# Patient Record
Sex: Male | Born: 1960 | Race: White | Hispanic: Yes | Marital: Married | Smoking: Never smoker
Health system: Southern US, Community
[De-identification: ages and names within clinical notes are randomized; demographics above are authoritative.]

## PROBLEM LIST (undated history)

## (undated) ENCOUNTER — Ambulatory Visit: Admission: EM | Payer: 59

## (undated) ENCOUNTER — Ambulatory Visit: Payer: 59

## (undated) DIAGNOSIS — J45909 Unspecified asthma, uncomplicated: Secondary | ICD-10-CM

## (undated) DIAGNOSIS — I10 Essential (primary) hypertension: Secondary | ICD-10-CM

---

## 2017-08-28 ENCOUNTER — Emergency Department (HOSPITAL_COMMUNITY)
Admission: EM | Admit: 2017-08-28 | Discharge: 2017-08-28 | Discharge status: Against Medical Advice | Payer: Self-pay | Attending: Emergency Medicine | Admitting: Emergency Medicine

## 2017-08-28 ENCOUNTER — Emergency Department (HOSPITAL_COMMUNITY): Payer: Self-pay

## 2017-08-28 ENCOUNTER — Encounter (HOSPITAL_COMMUNITY): Payer: Self-pay | Admitting: Emergency Medicine

## 2017-08-28 DIAGNOSIS — R079 Chest pain, unspecified: Secondary | ICD-10-CM | POA: Insufficient documentation

## 2017-08-28 DIAGNOSIS — Z5321 Procedure and treatment not carried out due to patient leaving prior to being seen by health care provider: Secondary | ICD-10-CM | POA: Insufficient documentation

## 2017-08-28 HISTORY — DX: Essential (primary) hypertension: I10

## 2017-08-28 NOTE — ED Triage Notes (Signed)
Pt c/o left chest spasms when he lifts his arms or lays back. Pt denies any pain at this time.

## 2017-08-28 NOTE — ED Notes (Signed)
Pt left facility per registration. 

## 2018-03-15 ENCOUNTER — Emergency Department (HOSPITAL_COMMUNITY)
Admission: EM | Admit: 2018-03-15 | Discharge: 2018-03-15 | Disposition: A | Discharge status: Home / Self Care | Payer: Self-pay | Attending: Emergency Medicine | Admitting: Emergency Medicine

## 2018-03-15 ENCOUNTER — Other Ambulatory Visit: Payer: Self-pay

## 2018-03-15 ENCOUNTER — Encounter (HOSPITAL_COMMUNITY): Payer: Self-pay | Admitting: *Deleted

## 2018-03-15 DIAGNOSIS — I10 Essential (primary) hypertension: Secondary | ICD-10-CM | POA: Insufficient documentation

## 2018-03-15 DIAGNOSIS — R42 Dizziness and giddiness: Secondary | ICD-10-CM | POA: Insufficient documentation

## 2018-03-15 DIAGNOSIS — E860 Dehydration: Secondary | ICD-10-CM | POA: Insufficient documentation

## 2018-03-15 HISTORY — DX: Unspecified asthma, uncomplicated: J45.909

## 2018-03-15 LAB — COMPREHENSIVE METABOLIC PANEL
ALT: 42 U/L (ref 17–63)
AST: 27 U/L (ref 15–41)
Albumin: 4.3 g/dL (ref 3.5–5.0)
Alkaline Phosphatase: 60 U/L (ref 38–126)
Anion gap: 10 (ref 5–15)
BUN: 22 mg/dL — ABNORMAL HIGH (ref 6–20)
CHLORIDE: 103 mmol/L (ref 101–111)
CO2: 23 mmol/L (ref 22–32)
CREATININE: 1.12 mg/dL (ref 0.61–1.24)
Calcium: 10.4 mg/dL — ABNORMAL HIGH (ref 8.9–10.3)
Glucose, Bld: 140 mg/dL — ABNORMAL HIGH (ref 65–99)
POTASSIUM: 4.2 mmol/L (ref 3.5–5.1)
SODIUM: 136 mmol/L (ref 135–145)
Total Bilirubin: 0.7 mg/dL (ref 0.3–1.2)
Total Protein: 7.6 g/dL (ref 6.5–8.1)

## 2018-03-15 LAB — URINALYSIS, ROUTINE W REFLEX MICROSCOPIC
BACTERIA UA: NONE SEEN
BILIRUBIN URINE: NEGATIVE
Glucose, UA: NEGATIVE mg/dL
KETONES UR: 5 mg/dL — AB
Leukocytes, UA: NEGATIVE
NITRITE: NEGATIVE
Protein, ur: NEGATIVE mg/dL
SQUAMOUS EPITHELIAL / LPF: NONE SEEN
Specific Gravity, Urine: 1.016 (ref 1.005–1.030)
pH: 5 (ref 5.0–8.0)

## 2018-03-15 LAB — CBC WITH DIFFERENTIAL/PLATELET
Basophils Absolute: 0 10*3/uL (ref 0.0–0.1)
Basophils Relative: 0 %
EOS ABS: 0.2 10*3/uL (ref 0.0–0.7)
Eosinophils Relative: 2 %
HEMATOCRIT: 53 % — AB (ref 39.0–52.0)
Hemoglobin: 17.5 g/dL — ABNORMAL HIGH (ref 13.0–17.0)
LYMPHS PCT: 27 %
Lymphs Abs: 1.9 10*3/uL (ref 0.7–4.0)
MCH: 29.3 pg (ref 26.0–34.0)
MCHC: 33 g/dL (ref 30.0–36.0)
MCV: 88.8 fL (ref 78.0–100.0)
MONOS PCT: 8 %
Monocytes Absolute: 0.6 10*3/uL (ref 0.1–1.0)
NEUTROS ABS: 4.3 10*3/uL (ref 1.7–7.7)
NEUTROS PCT: 63 %
Platelets: 214 10*3/uL (ref 150–400)
RBC: 5.97 MIL/uL — AB (ref 4.22–5.81)
RDW: 13.3 % (ref 11.5–15.5)
WBC: 6.9 10*3/uL (ref 4.0–10.5)

## 2018-03-15 LAB — I-STAT TROPONIN, ED: Troponin i, poc: 0 ng/mL (ref 0.00–0.08)

## 2018-03-15 MED ORDER — FOSINOPRIL SODIUM 40 MG PO TABS: 40.0000 mg | ORAL_TABLET | Freq: Every day | ORAL | 0 refills | Status: DC

## 2018-03-15 NOTE — ED Provider Notes (Signed)
Emergency Department Provider Note   I have reviewed the triage vital signs and the nursing notes.   HISTORY  Chief Complaint Dizziness   HPI Minerva FesterDavid Kuhlmann is a 57 y.o. male with PMH of HTN presents to the emergency department for evaluation of lightheadedness, elevated blood pressure, vision changes with using glasses over the past week.  The patient recently had a severe gastrointestinal infection which resulted in dehydration and a syncopal event.  He is no longer having vomiting or diarrhea but has noticed generalized fatigue with some lightheadedness.  He has noticed some changes in his vision which he has only when using his glasses.  If he removes the glasses he no longer has these changes.  He states it is not exactly blurry vision but more like he feels when he needs to have his prescription updated, although states it was just updated 4 months ago.  He checked his blood pressure today and found it to be significantly elevated.  Since having the GI illness he has been out of the routine of taking his blood pressure medication.    Past Medical History:  Diagnosis Date  . Asthma    as a child  . Hypertension     There are no active problems to display for this patient.   History reviewed. No pertinent surgical history.    Allergies Patient has no known allergies.  No family history on file.  Social History Social History   Tobacco Use  . Smoking status: Never Smoker  . Smokeless tobacco: Never Used  Substance Use Topics  . Alcohol use: No  . Drug use: No    Review of Systems  Constitutional: No fever/chills. Positive lightheadedness.  Eyes: Positive visual changes. ENT: No sore throat. Cardiovascular: Denies chest pain. Elevated BP.  Respiratory: Denies shortness of breath. Gastrointestinal: No abdominal pain.  No nausea, no vomiting.  No diarrhea.  No constipation. Genitourinary: Negative for dysuria. Musculoskeletal: Negative for back pain. Skin:  Negative for rash. Neurological: Negative for headaches, focal weakness or numbness.  10-point ROS otherwise negative.  ____________________________________________   PHYSICAL EXAM:  VITAL SIGNS: ED Triage Vitals  Enc Vitals Group     BP 03/15/18 1713 (!) 182/105     Pulse Rate 03/15/18 1713 (!) 116     Resp 03/15/18 1713 16     Temp 03/15/18 1713 98.7 F (37.1 C)     Temp Source 03/15/18 1713 Oral     SpO2 03/15/18 1713 97 %     Weight 03/15/18 1713 195 lb (88.5 kg)     Height 03/15/18 1713 5\' 6"  (1.676 m)     Pain Score 03/15/18 1714 0   Constitutional: Alert and oriented. Well appearing and in no acute distress. Eyes: Conjunctivae are normal. PERRL. EOMI. Head: Atraumatic. Nose: No congestion/rhinnorhea. Mouth/Throat: Mucous membranes are moist.  Neck: No stridor.  Cardiovascular: Normal rate, regular rhythm. Good peripheral circulation. Grossly normal heart sounds.   Respiratory: Normal respiratory effort.  No retractions. Lungs CTAB. Gastrointestinal: Soft and nontender. No distention.  Musculoskeletal: No lower extremity tenderness nor edema. No gross deformities of extremities. Neurologic:  Normal speech and language. No gross focal neurologic deficits are appreciated. Normal finger-to-nose testing. Normal gait.  Skin:  Skin is warm, dry and intact. No rash noted.  ____________________________________________   LABS (all labs ordered are listed, but only abnormal results are displayed)  Labs Reviewed  COMPREHENSIVE METABOLIC PANEL - Abnormal; Notable for the following components:      Result  Value   Glucose, Bld 140 (*)    BUN 22 (*)    Calcium 10.4 (*)    All other components within normal limits  CBC WITH DIFFERENTIAL/PLATELET - Abnormal; Notable for the following components:   RBC 5.97 (*)    Hemoglobin 17.5 (*)    HCT 53.0 (*)    All other components within normal limits  URINALYSIS, ROUTINE W REFLEX MICROSCOPIC - Abnormal; Notable for the following  components:   Color, Urine AMBER (*)    APPearance CLOUDY (*)    Hgb urine dipstick MODERATE (*)    Ketones, ur 5 (*)    All other components within normal limits  I-STAT TROPONIN, ED   ____________________________________________  EKG   EKG Interpretation  Date/Time:  Thursday March 15 2018 17:15:49 EDT Ventricular Rate:  112 PR Interval:  158 QRS Duration: 84 QT Interval:  324 QTC Calculation: 442 R Axis:   25 Text Interpretation:  Sinus tachycardia Abnormal ECG No STEMI.  Confirmed by Alona Bene [phone]) on 03/15/2018 7:06:18 PM       ____________________________________________  RADIOLOGY  None ____________________________________________   PROCEDURES  Procedure(s) performed:   Procedures  None ____________________________________________   INITIAL IMPRESSION / ASSESSMENT AND PLAN / ED COURSE  Pertinent labs & imaging results that were available during my care of the patient were reviewed by me and considered in my medical decision making (see chart for details).  Patient presents to the emergency department for evaluation of intermittent lightheadedness with elevated blood pressure noticed today along with vision changes.  The vision changes are only present when he is wearing his glasses and resolve when he takes them off.  He has normal extraocular movements.  His neurological exam is completely unremarkable including gait along with finger to nose testing.  He recently got over a severe gastrointestinal infection so suspect some ongoing mild dehydration but he is tolerating oral fluids.  With his blood pressure being elevated I will not be administering IV fluids here in the emergency department.  I have no signs on exam to suggest hypertension emergency.  The patient's EKG reviewed with no acute findings. No indication for advance brain imaging at this time.   Labs reviewed with no acute findings. Refilled patient's BP meds and he will f/u with PCP regarding  BP follow up. Will call optometry regarding eyeglass Rx change. Discussed ED return precautions in detail.   At this time, I do not feel there is any life-threatening condition present. I have reviewed and discussed all results (EKG, imaging, lab, urine as appropriate), exam findings with patient. I have reviewed nursing notes and appropriate previous records.  I feel the patient is safe to be discharged home without further emergent workup. Discussed usual and customary return precautions. Patient and family (if present) verbalize understanding and are comfortable with this plan.  Patient will follow-up with their primary care provider. If they do not have a primary care provider, information for follow-up has been provided to them. All questions have been answered.  ____________________________________________  FINAL CLINICAL IMPRESSION(S) / ED DIAGNOSES  Final diagnoses:  Lightheadedness  Dehydration  Essential hypertension    Note:  This document was prepared using Dragon voice recognition software and may include unintentional dictation errors.  Alona Bene, MD Emergency Medicine    Kierre Deines, Arlyss Repress, MD 03/16/18 1005

## 2018-03-15 NOTE — ED Triage Notes (Signed)
Pt c/o syncopal episode about a week ago and since then has been having blurry vision, dizziness, and high blood pressure. Pt saw his eye doctor and some slight adjustments were made to his glasses but pt still continues to have trouble with his vision. Pt reports the dizziness started today. Pt reports his SBP has been up to 230. BP 182/105 in triage.

## 2018-03-15 NOTE — Discharge Instructions (Signed)

## 2018-03-15 NOTE — ED Notes (Signed)
Pt had iv to L AC. Iv taken out with cath intact. Nad. No bleeding

## 2019-01-24 IMAGING — DX DG CHEST 2V
2 series · 2 of 2 positions shown · non-contrast
Comparison: None.

CLINICAL DATA: Left upper chest pain for 2-3 days. History of
childhood asthma and hypertension.

EXAM:
CHEST  2 VIEW

[chest pa]
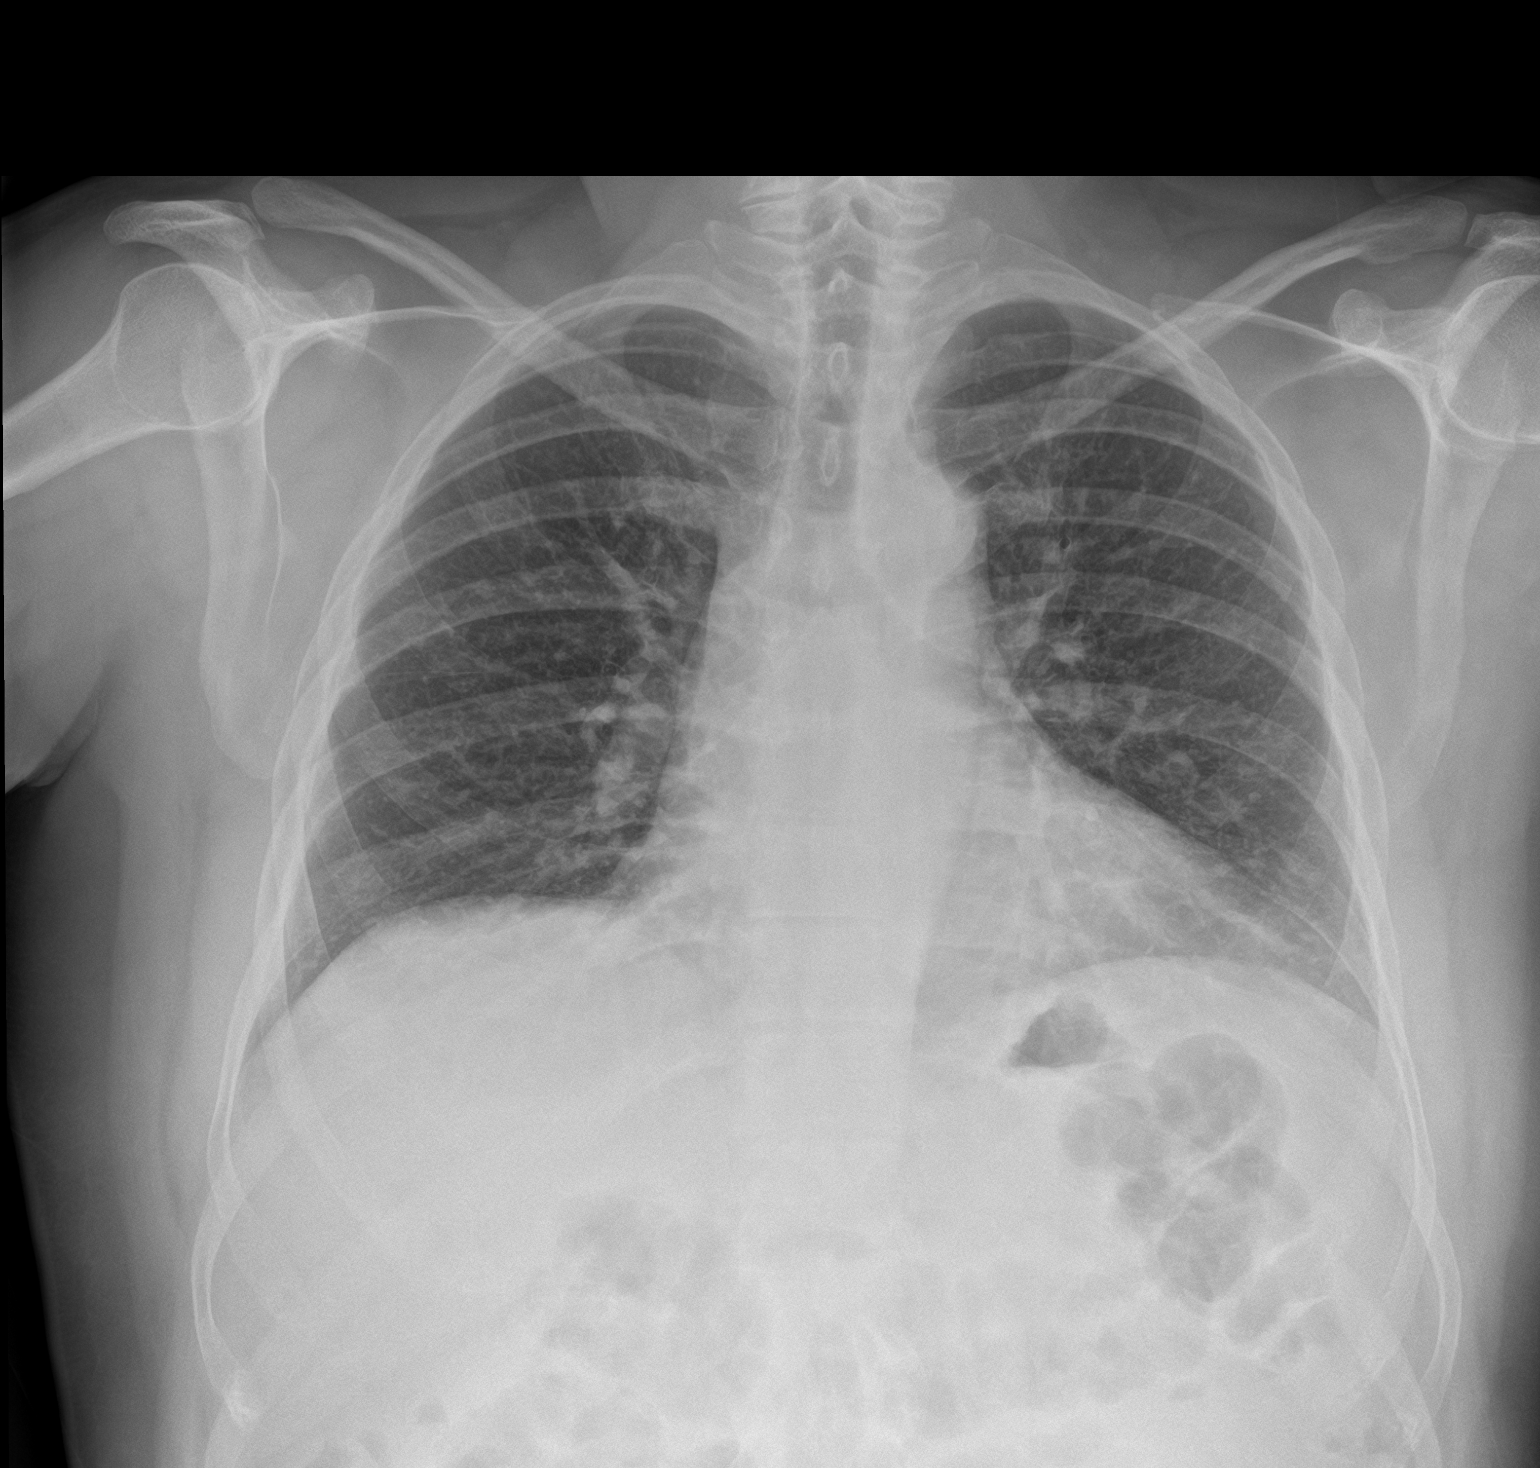

[chest lat]
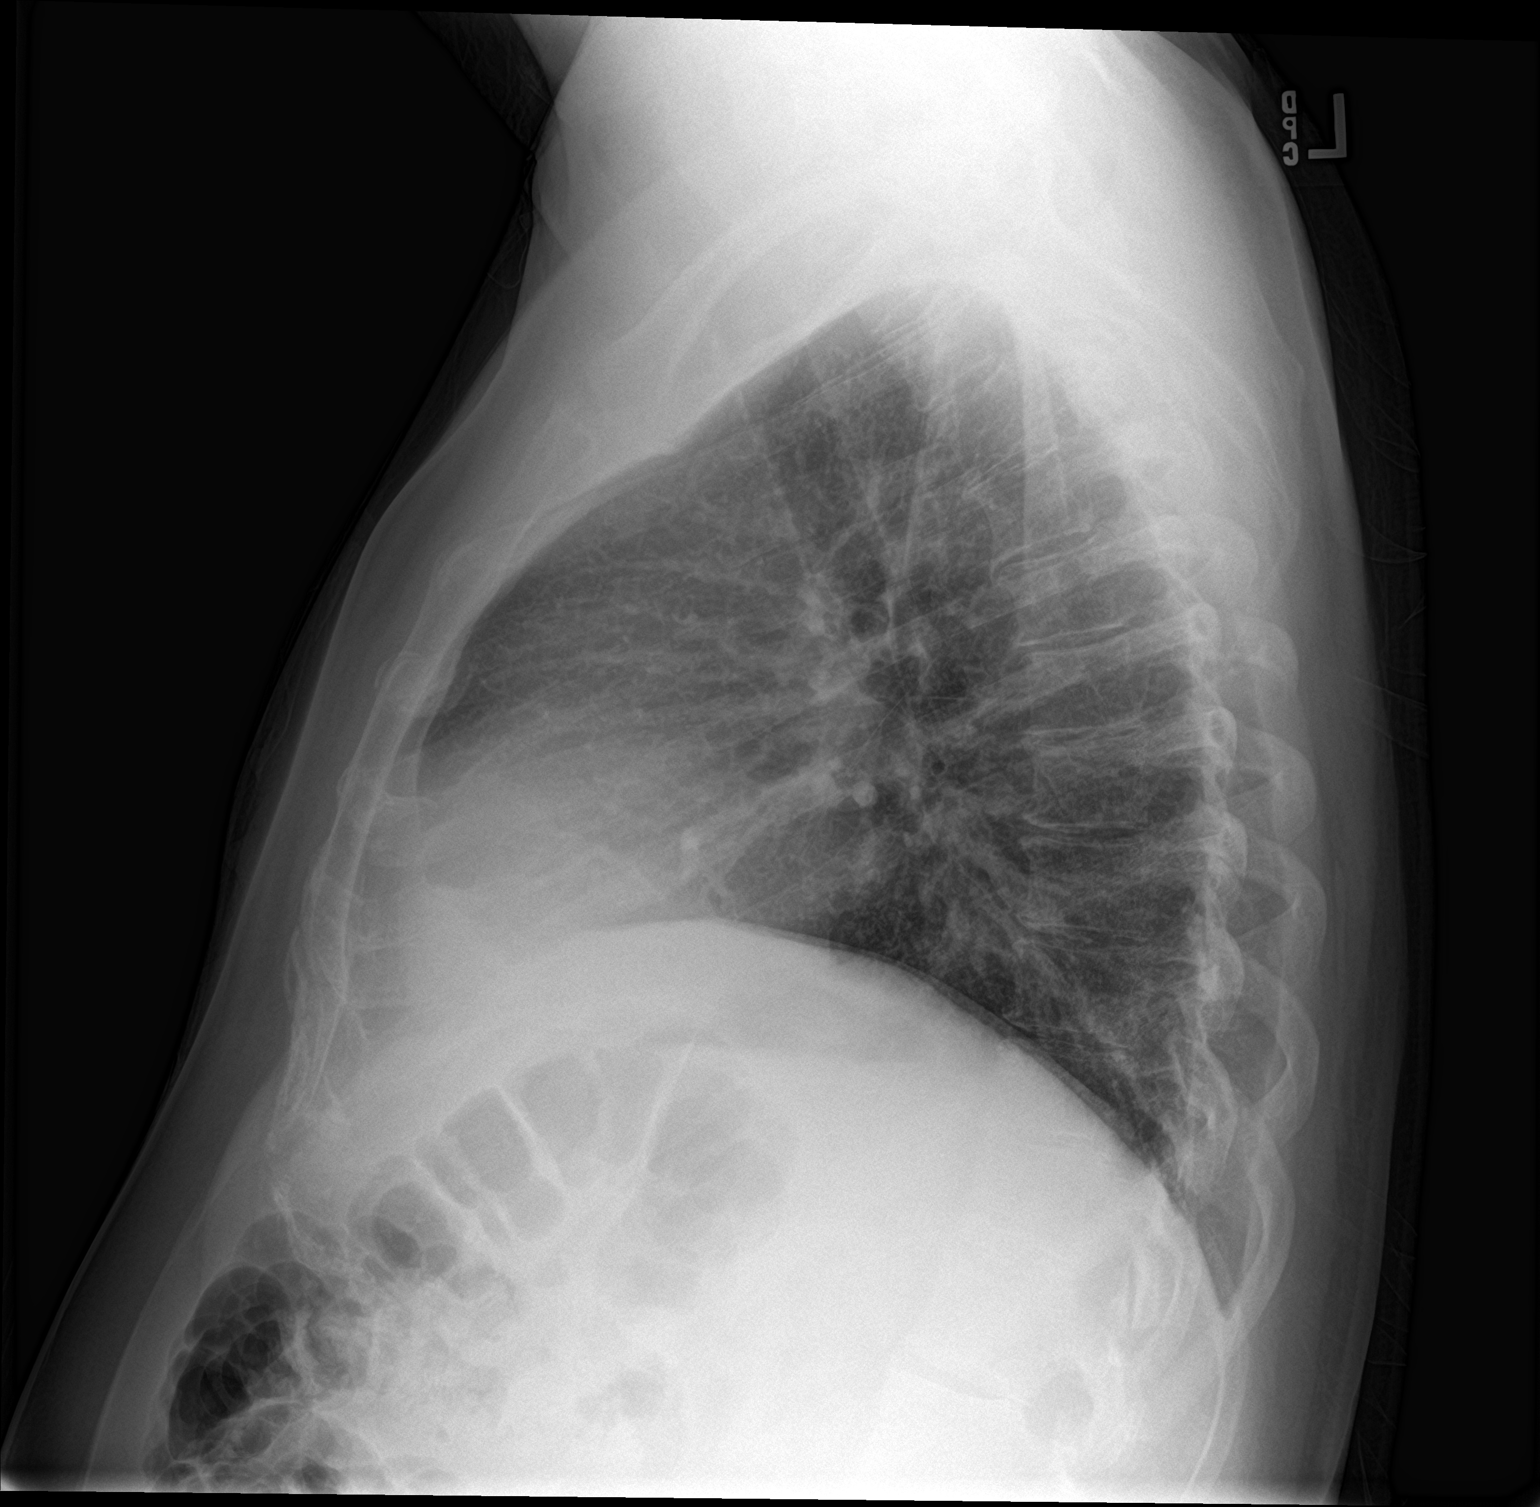

[2 of 2 positions shown; findings below may reference images not displayed]

FINDINGS: Heart size and mediastinal contours are within normal limits. Lungs
are clear. No pleural effusion or pneumothorax seen. Osseous
structures about the chest are unremarkable.
IMPRESSION: No active cardiopulmonary disease. No evidence of pneumonia or
pulmonary edema.

## 2019-10-01 ENCOUNTER — Other Ambulatory Visit: Payer: Self-pay | Admitting: *Deleted

## 2019-10-01 DIAGNOSIS — Z20822 Contact with and (suspected) exposure to covid-19: Secondary | ICD-10-CM

## 2019-10-02 LAB — NOVEL CORONAVIRUS, NAA: SARS-CoV-2, NAA: NOT DETECTED

## 2019-10-03 ENCOUNTER — Telehealth: Payer: Self-pay | Admitting: General Practice

## 2019-10-03 NOTE — Telephone Encounter (Signed)
Negative COVID results given. Patient results "NOT Detected." Caller expressed understanding. ° °

## 2020-04-28 ENCOUNTER — Ambulatory Visit: Payer: Self-pay | Admitting: Physician Assistant

## 2021-05-18 ENCOUNTER — Other Ambulatory Visit: Payer: Self-pay

## 2021-05-18 ENCOUNTER — Emergency Department (HOSPITAL_COMMUNITY)
Admission: EM | Admit: 2021-05-18 | Discharge: 2021-05-18 | Disposition: A | Discharge status: Home / Self Care | Payer: 59 | Attending: Emergency Medicine | Admitting: Emergency Medicine

## 2021-05-18 DIAGNOSIS — I160 Hypertensive urgency: Secondary | ICD-10-CM | POA: Diagnosis not present

## 2021-05-18 DIAGNOSIS — J45909 Unspecified asthma, uncomplicated: Secondary | ICD-10-CM | POA: Diagnosis not present

## 2021-05-18 DIAGNOSIS — Y900 Blood alcohol level of less than 20 mg/100 ml: Secondary | ICD-10-CM | POA: Diagnosis not present

## 2021-05-18 DIAGNOSIS — R030 Elevated blood-pressure reading, without diagnosis of hypertension: Secondary | ICD-10-CM | POA: Diagnosis present

## 2021-05-18 DIAGNOSIS — I10 Essential (primary) hypertension: Secondary | ICD-10-CM | POA: Diagnosis not present

## 2021-05-18 DIAGNOSIS — Z79899 Other long term (current) drug therapy: Secondary | ICD-10-CM | POA: Diagnosis not present

## 2021-05-18 LAB — CBC WITH DIFFERENTIAL/PLATELET
Abs Immature Granulocytes: 0.04 10*3/uL (ref 0.00–0.07)
Basophils Absolute: 0.1 10*3/uL (ref 0.0–0.1)
Basophils Relative: 1 %
Eosinophils Absolute: 0.1 10*3/uL (ref 0.0–0.5)
Eosinophils Relative: 2 %
HCT: 54.4 % — ABNORMAL HIGH (ref 39.0–52.0)
Hemoglobin: 18.2 g/dL — ABNORMAL HIGH (ref 13.0–17.0)
Immature Granulocytes: 1 %
Lymphocytes Relative: 30 %
Lymphs Abs: 2 10*3/uL (ref 0.7–4.0)
MCH: 30 pg (ref 26.0–34.0)
MCHC: 33.5 g/dL (ref 30.0–36.0)
MCV: 89.6 fL (ref 80.0–100.0)
Monocytes Absolute: 0.7 10*3/uL (ref 0.1–1.0)
Monocytes Relative: 9 %
Neutro Abs: 4 10*3/uL (ref 1.7–7.7)
Neutrophils Relative %: 57 %
Platelets: 227 10*3/uL (ref 150–400)
RBC: 6.07 MIL/uL — ABNORMAL HIGH (ref 4.22–5.81)
RDW: 13 % (ref 11.5–15.5)
WBC: 6.9 10*3/uL (ref 4.0–10.5)
nRBC: 0 % (ref 0.0–0.2)

## 2021-05-18 LAB — COMPREHENSIVE METABOLIC PANEL
ALT: 40 U/L (ref 0–44)
AST: 27 U/L (ref 15–41)
Albumin: 4.4 g/dL (ref 3.5–5.0)
Alkaline Phosphatase: 68 U/L (ref 38–126)
Anion gap: 8 (ref 5–15)
BUN: 13 mg/dL (ref 6–20)
CO2: 25 mmol/L (ref 22–32)
Calcium: 9.9 mg/dL (ref 8.9–10.3)
Chloride: 102 mmol/L (ref 98–111)
Creatinine, Ser: 0.99 mg/dL (ref 0.61–1.24)
GFR, Estimated: >60 mL/min (ref >60)
Glucose, Bld: 112 mg/dL — ABNORMAL HIGH (ref 70–99)
Potassium: 3.9 mmol/L (ref 3.5–5.1)
Sodium: 135 mmol/L (ref 135–145)
Total Bilirubin: 0.7 mg/dL (ref 0.3–1.2)
Total Protein: 7.6 g/dL (ref 6.5–8.1)

## 2021-05-18 LAB — URINALYSIS, ROUTINE W REFLEX MICROSCOPIC
Bilirubin Urine: NEGATIVE
Glucose, UA: 50 mg/dL — AB
Hgb urine dipstick: NEGATIVE
Ketones, ur: NEGATIVE mg/dL
Leukocytes,Ua: NEGATIVE
Nitrite: NEGATIVE
Protein, ur: NEGATIVE mg/dL
Specific Gravity, Urine: 1.004 — ABNORMAL LOW (ref 1.005–1.030)
pH: 6 (ref 5.0–8.0)

## 2021-05-18 LAB — ETHANOL: Alcohol, Ethyl (B): <10 mg/dL (ref <10)

## 2021-05-18 LAB — LIPASE, BLOOD: Lipase: 55 U/L — ABNORMAL HIGH (ref 11–51)

## 2021-05-18 NOTE — ED Provider Notes (Signed)
[facility] EMERGENCY DEPARTMENT Provider Note   CSN: [id number] Arrival date & time: 05/18/21  1851     History Chief Complaint  Patient presents with  . Hypertension    Shawn Campbell is a 60 y.o. male.  HPI Patient with a history of hypertension presents with concern for fatigue, feeling" lousy" and elevated blood pressure.  He notes that he is supposed to take a full dose of Monopril, 40 mg daily, but has taking half of his dose.  Today, without obvious precipitant he began feeling lousy, his words.  No focal pain, no syncope, no vomiting.  Patient hypothesized about dehydration versus hypertension.  He drank additional fluids, but while checking his blood pressure found to be substantially elevated.  He notes that now, after sitting down, he does feel better, continues to deny focal pain, lightheadedness, confusion, weakness in any extremity.  However, with concern for hypertension he presents for evaluation.    Past Medical History:  Diagnosis Date  . Asthma    as a child  . Hypertension     There are no problems to display for this patient.   No past surgical history on file.     No family history on file.  Social History   Tobacco Use  . Smoking status: Never Smoker  . Smokeless tobacco: Never Used  Substance Use Topics  . Alcohol use: No  . Drug use: No    Home Medications Prior to Admission medications   Medication Sig Start Date End Date Taking? Authorizing Provider  fosinopril (MONOPRIL) 40 MG tablet Take 1 tablet (40 mg total) by mouth daily. 03/15/18 04/14/18  Long, Arlyss Repress, MD    Allergies    Patient has no known allergies.  Review of Systems   Review of Systems  Constitutional:       Per HPI, otherwise negative  HENT:       Per HPI, otherwise negative  Respiratory:       Per HPI, otherwise negative  Cardiovascular:       Per HPI, otherwise negative  Gastrointestinal: Negative for vomiting.  Endocrine:       Negative aside from HPI   Genitourinary:       Neg aside from HPI   Musculoskeletal:       Per HPI, otherwise negative  Skin: Negative.   Neurological: Negative for syncope.    Physical Exam Updated Vital Signs BP (!) 173/98 (BP Location: Right Arm)   Pulse 88   Temp 98.4 F (36.9 C) (Oral)   Resp 18   Ht 5\' 6"  (1.676 m)   Wt 88.6 kg   SpO2 100%   BMI 31.52 kg/m   Physical Exam Vitals and nursing note reviewed.  Constitutional:      General: He is not in acute distress.    Appearance: He is well-developed.  HENT:     Head: Normocephalic and atraumatic.  Eyes:     Conjunctiva/sclera: Conjunctivae normal.  Cardiovascular:     Rate and Rhythm: Normal rate and regular rhythm.  Pulmonary:     Effort: Pulmonary effort is normal. No respiratory distress.     Breath sounds: No stridor.  Abdominal:     General: There is no distension.  Skin:    General: Skin is warm and dry.  Neurological:     Mental Status: He is alert and oriented to person, place, and time.      ED Results / Procedures / Treatments   Labs (all labs ordered  are listed, but only abnormal results are displayed) Labs Reviewed  URINALYSIS, ROUTINE W REFLEX MICROSCOPIC - Abnormal; Notable for the following components:      Result Value   Color, Urine STRAW (*)    Specific Gravity, Urine 1.004 (*)    Glucose, UA 50 (*)    All other components within normal limits  COMPREHENSIVE METABOLIC PANEL  ETHANOL  LIPASE, BLOOD  CBC WITH DIFFERENTIAL/PLATELET    EKG None  Radiology No results found.  Procedures Procedures   Medications Ordered in ED Medications - No data to display  ED Course  I have reviewed the triage vital signs and the nursing notes.  Pertinent labs & imaging results that were available during my care of the patient were reviewed by me and considered in my medical decision making (see chart for details).    10:28 PM Patient in no distress, awake, alert, sitting upright, and ambulatory without  complaints.  We discussed his labs which are generally reassuring, unremarkable.  Patient has trivial lipase elevation, but no vomiting, no diarrhea.  Blood pressure improved.  He and I discussed importance of taking his medication as directed, staying more well-hydrated, which is likely contributing to his presentation, weakness, feeling lousy.  Given his improvement here, patient will take full-strength medication, follow-up with primary care, patient discharged in stable condition peer MDM Rules/Calculators/A&P MDM Number of Diagnoses or Management Options Hypertensive urgency: new, needed workup   Amount and/or Complexity of Data Reviewed Clinical lab tests: ordered and reviewed Tests in the medicine section of CPT: reviewed and ordered Decide to obtain previous medical records or to obtain history from someone other than the patient: yes Review and summarize past medical records: yes  Risk of Complications, Morbidity, and/or Mortality Presenting problems: high Diagnostic procedures: high Management options: high   Final Clinical Impression(s) / ED Diagnoses Final diagnoses:  Hypertensive urgency     Gerhard Munch, MD 05/18/21 2229

## 2021-05-18 NOTE — ED Triage Notes (Signed)
Pt. States they had 2-3 cups of coffee this morning and maybe one glass of water. Pt. States they went to [employer] and used the machine to check their blood pressure. According to the pt. The machine read a high blood pressure. Pt. Is also concerned about being dehydrated from working outside all day.

## 2021-05-18 NOTE — Discharge Instructions (Addendum)
As discussed, your evaluation today has been largely reassuring.  But, it is important that you monitor your condition carefully, and do not hesitate to return to the ED if you develop new, or concerning changes in your condition. ? ?Otherwise, please follow-up with your physician for appropriate ongoing care. ? ?

## 2021-05-18 NOTE — ED Notes (Signed)
Pt ambulated to bathroom without difficulty. Gait steady. Pt attempting to provide urine sample at this time.

## 2021-12-23 ENCOUNTER — Ambulatory Visit
Admission: EM | Admit: 2021-12-23 | Discharge: 2021-12-23 | Disposition: A | Discharge status: Home / Self Care | Payer: 59 | Attending: Urgent Care | Admitting: Urgent Care

## 2021-12-23 ENCOUNTER — Encounter: Payer: Self-pay | Admitting: Emergency Medicine

## 2021-12-23 ENCOUNTER — Other Ambulatory Visit: Payer: Self-pay

## 2021-12-23 DIAGNOSIS — R030 Elevated blood-pressure reading, without diagnosis of hypertension: Secondary | ICD-10-CM | POA: Diagnosis not present

## 2021-12-23 DIAGNOSIS — I10 Essential (primary) hypertension: Secondary | ICD-10-CM | POA: Diagnosis not present

## 2021-12-23 MED ORDER — FOSINOPRIL SODIUM 40 MG PO TABS: 40.0000 mg | ORAL_TABLET | Freq: Every day | ORAL | 0 refills | Status: DC

## 2021-12-23 NOTE — ED Triage Notes (Signed)
Ran out of BP medication and cannot see PCP until end of month.  Monopril 40 mg daily.

## 2021-12-23 NOTE — Discharge Instructions (Signed)

## 2021-12-23 NOTE — ED Provider Notes (Signed)
[facility]   MRN: [id number] DOB: 25-Feb-1961  Subjective:   Shawn Campbell is a 61 y.o. male presenting for medication refill of his blood pressure medication.  He was trying to get a refill over the holidays but has had a difficult time getting a hold of his primary care practice and primary care provider.  Now he is considering going to a different practice.  Denies headache, confusion, weakness, numbness or tingling, chest pain, shortness of breath, heart racing, nausea, vomiting, abdominal pain.  No history of heart disease, stroke.  No current facility-administered medications for this encounter.  Current Outpatient Medications:    fosinopril (MONOPRIL) 40 MG tablet, Take 1 tablet (40 mg total) by mouth daily., Disp: 30 tablet, Rfl: 0   No Known Allergies  Past Medical History:  Diagnosis Date   Asthma    as a child   Hypertension      History reviewed. No pertinent surgical history.  History reviewed. No pertinent family history.  Social History   Tobacco Use   Smoking status: Never   Smokeless tobacco: Never  Substance Use Topics   Alcohol use: No   Drug use: No    ROS   Objective:   Vitals: BP (!) 145/91 (BP Location: Right Arm)    Pulse 89    Temp 98.1 F (36.7 C) (Oral)    Resp 20    SpO2 97%   Physical Exam Constitutional:      General: He is not in acute distress.    Appearance: Normal appearance. He is well-developed. He is not ill-appearing, toxic-appearing or diaphoretic.  HENT:     Head: Normocephalic and atraumatic.     Right Ear: External ear normal.     Left Ear: External ear normal.     Nose: Nose normal.     Mouth/Throat:     Mouth: Mucous membranes are moist.  Eyes:     General: No scleral icterus.       Right eye: No discharge.        Left eye: No discharge.     Extraocular Movements: Extraocular movements intact.  Cardiovascular:     Rate and Rhythm: Normal rate and regular rhythm.     Heart sounds: Normal heart  sounds. No murmur heard.   No friction rub. No gallop.  Pulmonary:     Effort: Pulmonary effort is normal. No respiratory distress.     Breath sounds: Normal breath sounds. No stridor. No wheezing, rhonchi or rales.  Neurological:     Mental Status: He is alert and oriented to person, place, and time.     Cranial Nerves: No cranial nerve deficit.     Motor: No weakness.     Coordination: Coordination normal.     Gait: Gait normal.     Comments: Negative Romberg and pronator drift, no facial asymmetry.  Psychiatric:        Mood and Affect: Mood normal.        Behavior: Behavior normal.        Thought Content: Thought content normal.        Judgment: Judgment normal.    Assessment and Plan :   PDMP not reviewed this encounter.  1. Essential hypertension   2. Elevated blood pressure reading    Provided patient with a 90-day refill of his fosinopril.  Recommended different primary care providers that he could try and establish care with.  Encouraged hypertensive friendly diet.  Counseled patient on potential for adverse effects  with medications prescribed/recommended today, ER and return-to-clinic precautions discussed, patient verbalized understanding.    Wallis Bamberg, [place] 12/23/21 [phone]

## 2022-04-14 ENCOUNTER — Encounter: Payer: Self-pay | Admitting: Emergency Medicine

## 2022-04-14 ENCOUNTER — Ambulatory Visit
Admission: EM | Admit: 2022-04-14 | Discharge: 2022-04-14 | Disposition: A | Discharge status: Home / Self Care | Payer: 59 | Attending: Nurse Practitioner | Admitting: Nurse Practitioner

## 2022-04-14 ENCOUNTER — Other Ambulatory Visit: Payer: Self-pay

## 2022-04-14 DIAGNOSIS — Z760 Encounter for issue of repeat prescription: Secondary | ICD-10-CM

## 2022-04-14 DIAGNOSIS — I10 Essential (primary) hypertension: Secondary | ICD-10-CM

## 2022-04-14 MED ORDER — FOSINOPRIL SODIUM 40 MG PO TABS: 40.0000 mg | ORAL_TABLET | Freq: Every day | ORAL | 0 refills | Status: DC

## 2022-04-14 NOTE — ED Provider Notes (Signed)
?Shawn Campbell ? ? ? ?CSN: [id number] ?Arrival date & time: 04/14/22  1022 ? ? ?  ? ?History   ?Chief Complaint ?Chief Complaint  ?Patient presents with  ? Medication Refill  ? ? ?HPI ?Nakoma Wilbers is a 61 y.o. male.  ? ?The patient is a 62 year old male who presents for a medication refill.  He reports ran out of fosinopril yesterday. His insurance restarted by different company after losing it in January.  He has also been between pcp and would like a refill.  He reports last dose yesterday and is prescribed to take it daily.  Denies chest pain, shortness of breath, headache, other symptoms.  States that he is in the process of obtaining a PCP. ? ? ? ? ?Medication Refill ?Reason for request:  Medications ran out ? ?Past Medical History:  ?Diagnosis Date  ? Asthma   ? as a child  ? Hypertension   ? ? ?There are no problems to display for this patient. ? ? ?History reviewed. No pertinent surgical history. ? ? ? ? ?Home Medications   ? ?Prior to Admission medications   ?Medication Sig Start Date End Date Taking? Authorizing Provider  ?fosinopril (MONOPRIL) 40 MG tablet Take 1 tablet (40 mg total) by mouth daily. 12/23/21 01/22/22  Jaynee Eagles, PA-C  ? ? ?Family History ?History reviewed. No pertinent family history. ? ?Social History ?Social History  ? ?Tobacco Use  ? Smoking status: Never  ? Smokeless tobacco: Never  ?Substance Use Topics  ? Alcohol use: No  ? Drug use: No  ? ? ? ?Allergies   ?Patient has no known allergies. ? ? ?Review of Systems ?Review of Systems  ?Constitutional: Negative.   ?Respiratory: Negative.    ?Cardiovascular: Negative.   ?Gastrointestinal: Negative.   ?Skin: Negative.   ?Psychiatric/Behavioral: Negative.    ? ? ?Physical Exam ?Triage Vital Signs ?ED Triage Vitals  ?Enc Vitals Group  ?   BP 04/14/22 1036 (!) 154/88  ?   Pulse Rate 04/14/22 1036 96  ?   Resp 04/14/22 1036 18  ?   Temp 04/14/22 1036 98.1 ?F (36.7 ?C)  ?   Temp Source 04/14/22 1036 Oral  ?   SpO2 04/14/22 1036 96 %  ?    Weight 04/14/22 1037 180 lb (81.6 kg)  ?   Height 04/14/22 1037 5\' 6"  (1.676 m)  ?   Head Circumference --   ?   Peak Flow --   ?   Pain Score 04/14/22 1037 0  ?   Pain Loc --   ?   Pain Edu? --   ?   Excl. in [place]? --   ? ?No data found. ? ?Updated Vital Signs ?BP (!) 154/88 (BP Location: Right Arm)   Pulse 96   Temp 98.1 ?F (36.7 ?C) (Oral)   Resp 18   Ht 5\' 6"  (1.676 m)   Wt 180 lb (81.6 kg)   SpO2 96%   BMI 29.05 kg/m?  ? ?Visual Acuity ?Right Eye Distance:   ?Left Eye Distance:   ?Bilateral Distance:   ? ?Right Eye Near:   ?Left Eye Near:    ?Bilateral Near:    ? ?Physical Exam ?Vitals and nursing note reviewed.  ?Constitutional:   ?   General: He is not in acute distress. ?   Appearance: He is well-developed.  ?HENT:  ?   Head: Normocephalic and atraumatic.  ?   Mouth/Throat:  ?   Mouth: Mucous membranes are moist.  ?  Eyes:  ?   Conjunctiva/sclera: Conjunctivae normal.  ?   Pupils: Pupils are equal, round, and reactive to light.  ?Cardiovascular:  ?   Rate and Rhythm: Normal rate and regular rhythm.  ?   Heart sounds: No murmur heard. ?Pulmonary:  ?   Effort: Pulmonary effort is normal. No respiratory distress.  ?   Breath sounds: Normal breath sounds.  ?Abdominal:  ?   General: Bowel sounds are normal.  ?   Palpations: Abdomen is soft.  ?   Tenderness: There is no abdominal tenderness.  ?Musculoskeletal:     ?   General: No swelling.  ?   Cervical back: Normal range of motion and neck supple.  ?Skin: ?   General: Skin is warm and dry.  ?   Capillary Refill: Capillary refill takes less than 2 seconds.  ?Neurological:  ?   General: No focal deficit present.  ?   Mental Status: He is alert and oriented to person, place, and time.  ?Psychiatric:     ?   Mood and Affect: Mood normal.  ? ? ? ?UC Treatments / Results  ?Labs ?(all labs ordered are listed, but only abnormal results are displayed) ?Labs Reviewed - No data to display ? ?EKG ? ? ?Radiology ?No results found. ? ?Procedures ?Procedures (including  critical care time) ? ?Medications Ordered in UC ?Medications - No data to display ? ?Initial Impression / Assessment and Plan / UC Course  ?I have reviewed the triage vital signs and the nursing notes. ? ?Pertinent labs & imaging results that were available during my care of the patient were reviewed by me and considered in my medical decision making (see chart for details). ? ?Patient is a 61 year old male who presents for medication refill of his fosinopril.  Patient took his last dose 1 day ago.  Patient is currently taking medication for previous diagnosis of hypertension.  His exam is normal today, there is no indication of chest pain, shortness of breath, or lower extremity edema.  Patient was advised to continue to seek out a primary care physician for further refills.  There was likely needs a complete physical for any other comorbidities.  Discussion with patient regarding increasing his exercise and maintaining a heart healthy and low-sodium diet to prevent exacerbation of hypertension.  Patient advised to follow-up as needed. ?Final Clinical Impressions(s) / UC Diagnoses  ? ?Final diagnoses:  ?None  ? ?Discharge Instructions   ?None ?  ? ?ED Prescriptions   ?None ?  ? ?PDMP not reviewed this encounter. ?  ?Tish Men, NP ?04/14/22 1058 ? ?

## 2022-04-14 NOTE — ED Triage Notes (Signed)
Pt reports ran out of fosinopril yesterday. Pt reports had insurance restarted by different company after losing it in January. Pt reports is also between pcp and would like a refill. Pt reports last dose yesterday and is prescribed to take it daily. ? ? Denies chest pain, shortness of breath, headache, other symptoms.  ?

## 2022-04-14 NOTE — Discharge Instructions (Addendum)
Take medication as prescribed. ?Continue to seek out a primary care physician for further refills and maintenance of your blood pressure. ?Continue a heart healthy diet. ?Recommend regular exercise.  Start with goals that you can achieve such as exercising 1 day/week at least 15 to 20 minutes/day.  Then increase your goals. ?To minimize stress and anxiety which can also impact her blood pressure. ?Follow-up as needed. ?

## 2022-06-22 DIAGNOSIS — Z760 Encounter for issue of repeat prescription: Secondary | ICD-10-CM | POA: Diagnosis not present

## 2022-06-22 DIAGNOSIS — I10 Essential (primary) hypertension: Secondary | ICD-10-CM | POA: Diagnosis not present

## 2022-06-22 DIAGNOSIS — Z6828 Body mass index (BMI) 28.0-28.9, adult: Secondary | ICD-10-CM | POA: Diagnosis not present

## 2022-06-27 DIAGNOSIS — R69 Illness, unspecified: Secondary | ICD-10-CM | POA: Diagnosis not present

## 2022-06-27 DIAGNOSIS — F419 Anxiety disorder, unspecified: Secondary | ICD-10-CM | POA: Diagnosis not present

## 2022-08-16 DIAGNOSIS — R69 Illness, unspecified: Secondary | ICD-10-CM | POA: Diagnosis not present

## 2022-08-25 DIAGNOSIS — R69 Illness, unspecified: Secondary | ICD-10-CM | POA: Diagnosis not present

## 2022-09-01 DIAGNOSIS — R69 Illness, unspecified: Secondary | ICD-10-CM | POA: Diagnosis not present

## 2022-09-07 DIAGNOSIS — R69 Illness, unspecified: Secondary | ICD-10-CM | POA: Diagnosis not present

## 2022-09-21 DIAGNOSIS — R69 Illness, unspecified: Secondary | ICD-10-CM | POA: Diagnosis not present

## 2022-10-05 DIAGNOSIS — R69 Illness, unspecified: Secondary | ICD-10-CM | POA: Diagnosis not present

## 2022-10-27 DIAGNOSIS — I10 Essential (primary) hypertension: Secondary | ICD-10-CM | POA: Diagnosis not present

## 2022-10-27 DIAGNOSIS — Z6830 Body mass index (BMI) 30.0-30.9, adult: Secondary | ICD-10-CM | POA: Diagnosis not present

## 2022-10-27 DIAGNOSIS — Z760 Encounter for issue of repeat prescription: Secondary | ICD-10-CM | POA: Diagnosis not present

## 2022-11-15 DIAGNOSIS — R69 Illness, unspecified: Secondary | ICD-10-CM | POA: Diagnosis not present

## 2022-12-28 DIAGNOSIS — R69 Illness, unspecified: Secondary | ICD-10-CM | POA: Diagnosis not present

## 2023-01-25 DIAGNOSIS — R69 Illness, unspecified: Secondary | ICD-10-CM | POA: Diagnosis not present

## 2023-02-15 DIAGNOSIS — F411 Generalized anxiety disorder: Secondary | ICD-10-CM | POA: Diagnosis not present

## 2023-03-29 DIAGNOSIS — F411 Generalized anxiety disorder: Secondary | ICD-10-CM | POA: Diagnosis not present

## 2023-03-30 ENCOUNTER — Encounter: Payer: Self-pay | Admitting: Emergency Medicine

## 2023-03-30 ENCOUNTER — Ambulatory Visit
Admission: EM | Admit: 2023-03-30 | Discharge: 2023-03-30 | Disposition: A | Discharge status: Home / Self Care | Payer: 59 | Attending: Nurse Practitioner | Admitting: Nurse Practitioner

## 2023-03-30 ENCOUNTER — Other Ambulatory Visit: Payer: Self-pay

## 2023-03-30 DIAGNOSIS — Z8679 Personal history of other diseases of the circulatory system: Secondary | ICD-10-CM

## 2023-03-30 DIAGNOSIS — Z760 Encounter for issue of repeat prescription: Secondary | ICD-10-CM

## 2023-03-30 MED ORDER — FOSINOPRIL SODIUM 40 MG PO TABS: 40.0000 mg | ORAL_TABLET | Freq: Every day | ORAL | 0 refills | Status: DC

## 2023-03-30 NOTE — ED Provider Notes (Signed)
[facility]    CSN: [id number] Arrival date & time: 03/30/23  0901      History   Chief Complaint Chief Complaint  Patient presents with   Medication Refill    HPI Shawn Campbell is a 62 y.o. male.   The history is provided by the patient.   The patient presents for a medication refill.  Patient is currently taking Fosinopril 40 mg for hypertension.  The patient denies chest pain, shortness of breath, difficulty breathing, or lower extremity edema.  He reports that he is scheduled to see a new physician at [facility] next month.  Past Medical History:  Diagnosis Date   Asthma    as a child   Hypertension     There are no problems to display for this patient.   History reviewed. No pertinent surgical history.     Home Medications    Prior to Admission medications   Medication Sig Start Date End Date Taking? Authorizing Provider  fosinopril (MONOPRIL) 40 MG tablet Take 1 tablet (40 mg total) by mouth daily. 04/14/22 06/13/22  Arleth Mccullar-Warren, Sadie Haber, NP    Family History Family History  Problem Relation Age of Onset   Diabetes Father     Social History Social History   Tobacco Use   Smoking status: Never   Smokeless tobacco: Never  Substance Use Topics   Alcohol use: No   Drug use: No     Allergies   Patient has no known allergies.   Review of Systems Review of Systems Per HPI  Physical Exam Triage Vital Signs ED Triage Vitals  Enc Vitals Group     BP 03/30/23 0906 (!) 162/94     Pulse Rate 03/30/23 0906 89     Resp 03/30/23 0906 18     Temp 03/30/23 0906 98.1 F (36.7 C)     Temp Source 03/30/23 0906 Oral     SpO2 03/30/23 0906 97 %     Weight --      Height --      Head Circumference --      Peak Flow --      Pain Score 03/30/23 0917 0     Pain Loc --      Pain Edu? --      Excl. in GC? --    No data found.  Updated Vital Signs BP (!) 162/94 (BP Location: Right Arm)   Pulse 89   Temp 98.1 F (36.7 C)  (Oral)   Resp 18   SpO2 97%   Visual Acuity Right Eye Distance:   Left Eye Distance:   Bilateral Distance:    Right Eye Near:   Left Eye Near:    Bilateral Near:     Physical Exam Vitals and nursing note reviewed.  Constitutional:      General: He is not in acute distress.    Appearance: Normal appearance.  HENT:     Head: Normocephalic.  Eyes:     Extraocular Movements: Extraocular movements intact.     Conjunctiva/sclera: Conjunctivae normal.     Pupils: Pupils are equal, round, and reactive to light.  Cardiovascular:     Rate and Rhythm: Normal rate and regular rhythm.     Pulses: Normal pulses.     Heart sounds: Normal heart sounds.  Pulmonary:     Effort: Pulmonary effort is normal. No respiratory distress.     Breath sounds: Normal breath sounds. No stridor. No wheezing, rhonchi or rales.  Abdominal:     General: Bowel sounds are normal.     Palpations: Abdomen is soft.     Tenderness: There is no abdominal tenderness.  Musculoskeletal:     Cervical back: Normal range of motion.     Right lower leg: No edema.     Left lower leg: No edema.  Lymphadenopathy:     Cervical: No cervical adenopathy.  Skin:    General: Skin is warm and dry.  Neurological:     General: No focal deficit present.     Mental Status: He is alert and oriented to person, place, and time.  Psychiatric:        Mood and Affect: Mood normal.        Behavior: Behavior normal.      UC Treatments / Results  Labs (all labs ordered are listed, but only abnormal results are displayed) Labs Reviewed - No data to display  EKG   Radiology No results found.  Procedures Procedures (including critical care time)  Medications Ordered in UC Medications - No data to display  Initial Impression / Assessment and Plan / UC Course  I have reviewed the triage vital signs and the nursing notes.  Pertinent labs & imaging results that were available during my care of the patient were reviewed by  me and considered in my medical decision making (see chart for details).  The patient is well-appearing, he is in no acute distress, vital signs are stable.  Refill provided for Fosinopril 20 mg.  Patient was given a 30-day supply.  Patient encouraged to keep his appointment with the PCP for next month.  Patient was given ER follow-up precautions.  Patient is in agreement with this plan of care and verbalizes understanding questions were answered.  Patient is stable for discharge.   Final Clinical Impressions(s) / UC Diagnoses   Final diagnoses:  None   Discharge Instructions   None    ED Prescriptions   None    PDMP not reviewed this encounter.   Abran Cantor, NP 03/30/23 [phone]

## 2023-03-30 NOTE — ED Triage Notes (Signed)
Patient states he has been traveling and wasn't able to get in PCP for refill on BP meds.  Patient takes Fosinopril 

## 2023-03-30 NOTE — Discharge Instructions (Signed)
Take medication as prescribed. Keep the appointment scheduled to see your PCP for next month. If you develop symptoms such as chest pain, shortness of breath, difficulty breathing, or swelling in your legs or feet, or other concerns, please follow-up in the emergency department. Follow-up as needed.

## 2023-04-18 DIAGNOSIS — F411 Generalized anxiety disorder: Secondary | ICD-10-CM | POA: Diagnosis not present

## 2023-05-04 ENCOUNTER — Ambulatory Visit
Admission: EM | Admit: 2023-05-04 | Discharge: 2023-05-04 | Disposition: A | Discharge status: Home / Self Care | Payer: Self-pay | Attending: Nurse Practitioner | Admitting: Nurse Practitioner

## 2023-05-04 DIAGNOSIS — I10 Essential (primary) hypertension: Secondary | ICD-10-CM

## 2023-05-04 MED ORDER — FOSINOPRIL SODIUM 40 MG PO TABS: 40.0000 mg | ORAL_TABLET | Freq: Every day | ORAL | 0 refills | Status: DC

## 2023-05-04 NOTE — ED Provider Notes (Signed)
[facility]    CSN: [id number] Arrival date & time: 05/04/23  1525      History   Chief Complaint No chief complaint on file.   HPI Shawn Campbell is a 62 y.o. male.   Patient presents today for elevated blood pressure.  Reports he ran out of his blood pressure medication 4 days ago.  Reports he was taking Monopril 40 mg daily.  No chest pain, shortness of breath, lightheadedness or dizziness.  No lower extremity swelling, vision changes, or headache.  Patient reports he notices when he is taking blood pressure medication, he is less short of breath.  Reports he has a new appointment scheduled with a PCP in approximately 2 weeks.    Past Medical History:  Diagnosis Date   Asthma    as a child   Hypertension     There are no problems to display for this patient.   History reviewed. No pertinent surgical history.     Home Medications    Prior to Admission medications   Medication Sig Start Date End Date Taking? Authorizing Provider  fosinopril (MONOPRIL) 40 MG tablet Take 1 tablet (40 mg total) by mouth daily. 05/04/23 06/03/23  Valentino Nose, NP    Family History Family History  Problem Relation Age of Onset   Diabetes Father     Social History Social History   Tobacco Use   Smoking status: Never   Smokeless tobacco: Never  Substance Use Topics   Alcohol use: No   Drug use: No     Allergies   Patient has no known allergies.   Review of Systems Review of Systems Per HPI  Physical Exam Triage Vital Signs ED Triage Vitals  Enc Vitals Group     BP 05/04/23 1535 (!) 153/103     Pulse Rate 05/04/23 1535 85     Resp 05/04/23 1535 16     Temp 05/04/23 1535 98.1 F (36.7 C)     Temp Source 05/04/23 1535 Oral     SpO2 05/04/23 1535 95 %     Weight --      Height --      Head Circumference --      Peak Flow --      Pain Score 05/04/23 1534 0     Pain Loc --      Pain Edu? --      Excl. in GC? --    No data found.  Updated  Vital Signs BP (!) 153/103 (BP Location: Right Arm)   Pulse 85   Temp 98.1 F (36.7 C) (Oral)   Resp 16   SpO2 95%   Visual Acuity Right Eye Distance:   Left Eye Distance:   Bilateral Distance:    Right Eye Near:   Left Eye Near:    Bilateral Near:     Physical Exam Vitals and nursing note reviewed.  Constitutional:      General: He is not in acute distress.    Appearance: Normal appearance. He is obese. He is not toxic-appearing.  Eyes:     General: No scleral icterus.    Extraocular Movements: Extraocular movements intact.  Neck:     Vascular: No carotid bruit.  Cardiovascular:     Rate and Rhythm: Normal rate and regular rhythm.  Pulmonary:     Effort: Pulmonary effort is normal. No respiratory distress.     Breath sounds: Normal breath sounds. No wheezing or rhonchi.  Abdominal:  General: There is no distension.  Musculoskeletal:        General: Normal range of motion.     Cervical back: Normal range of motion.     Right lower leg: No edema.     Left lower leg: No edema.  Skin:    General: Skin is warm and dry.     Coloration: Skin is not jaundiced or pale.  Neurological:     General: No focal deficit present.     Mental Status: He is alert and oriented to person, place, and time.  Psychiatric:        Behavior: Behavior is cooperative.      UC Treatments / Results  Labs (all labs ordered are listed, but only abnormal results are displayed) Labs Reviewed - No data to display  EKG   Radiology No results found.  Procedures Procedures (including critical care time)  Medications Ordered in UC Medications - No data to display  Initial Impression / Assessment and Plan / UC Course  I have reviewed the triage vital signs and the nursing notes.  Pertinent labs & imaging results that were available during my care of the patient were reviewed by me and considered in my medical decision making (see chart for details).   Patient is well-appearing,   afebrile, not tachycardic, not tachypneic, oxygenating well on room air.  Patient is mildly hypertensive today, likely secondary to running out of blood pressure medication.  1. Essential hypertension Monopril 40 mg refilled today Noted flags in history or on exam today Recommended checking kidney function, however patient declined and states he will do that when he follows up with PCP Discussed increasing hydration with water, DASH diet in the meantime Strict ER precautions discussed  The patient was given the opportunity to ask questions.  All questions answered to their satisfaction.  The patient is in agreement to this plan.    Final Clinical Impressions(s) / UC Diagnoses   Final diagnoses:  Essential hypertension     Discharge Instructions      Please resume taking the blood pressure medication.  Try to eat a low-salt diet and drink plenty of water.  Follow-up with future PCP as planned.  If you develop chest pain, shortness of breath, dizziness/lightheadedness, please return to be seen or go to the ER.    ED Prescriptions     Medication Sig Dispense Auth. Provider   fosinopril (MONOPRIL) 40 MG tablet Take 1 tablet (40 mg total) by mouth daily. 30 tablet Valentino Nose, NP      PDMP not reviewed this encounter.   Valentino Nose, NP 05/04/23 1558

## 2023-05-04 NOTE — ED Triage Notes (Signed)
Pt reports since his appoint has been canceled until June 11th with his PCP he is in need of a BP med refill since 5/19.

## 2023-05-04 NOTE — Discharge Instructions (Signed)
Please resume taking the blood pressure medication.  Try to eat a low-salt diet and drink plenty of water.  Follow-up with future PCP as planned.  If you develop chest pain, shortness of breath, dizziness/lightheadedness, please return to be seen or go to the ER.

## 2023-05-10 DIAGNOSIS — F411 Generalized anxiety disorder: Secondary | ICD-10-CM | POA: Diagnosis not present

## 2023-06-04 ENCOUNTER — Other Ambulatory Visit: Payer: Self-pay | Admitting: Nurse Practitioner

## 2023-06-08 ENCOUNTER — Other Ambulatory Visit: Payer: Self-pay | Admitting: Nurse Practitioner

## 2023-06-09 NOTE — Telephone Encounter (Signed)
Unable to refill per protocol, last refill by another provider not at this practice.  Requested Prescriptions  Pending Prescriptions Disp Refills   fosinopril (MONOPRIL) 40 MG tablet [Pharmacy Med Name: FOSINOPRIL SODIUM 40 MG TAB] 30 tablet 0    Sig: TAKE 1 TABLET BY MOUTH EVERY DAY     Cardiovascular:  ACE Inhibitors Failed - 06/08/2023  2:07 PM      Failed - Cr in normal range and within 180 days    Creatinine, Ser  Date Value Ref Range Status  05/18/2021 0.99 0.61 - 1.24 mg/dL Final         Failed - K in normal range and within 180 days    Potassium  Date Value Ref Range Status  05/18/2021 3.9 3.5 - 5.1 mmol/L Final         Failed - Last BP in normal range    BP Readings from Last 1 Encounters:  05/04/23 (!) 153/103         Failed - Valid encounter within last 6 months    Recent Outpatient Visits   None            Passed - Patient is not pregnant

## 2023-06-21 ENCOUNTER — Ambulatory Visit
Admission: EM | Admit: 2023-06-21 | Discharge: 2023-06-21 | Disposition: A | Discharge status: Home / Self Care | Payer: 59 | Attending: Nurse Practitioner | Admitting: Nurse Practitioner

## 2023-06-21 DIAGNOSIS — I10 Essential (primary) hypertension: Secondary | ICD-10-CM

## 2023-06-21 MED ORDER — FOSINOPRIL SODIUM 40 MG PO TABS: 40.0000 mg | ORAL_TABLET | Freq: Every day | ORAL | 0 refills | Status: AC

## 2023-06-21 NOTE — Discharge Instructions (Addendum)
Please resume taking the blood pressure medication.  You have been given enough medication to last until you are able to follow-up with the PCP. Try to eat a low-salt diet and drink plenty of water.  Also consider regular exercise to help with your blood pressure and your anxiety.  Follow-up with future PCP as planned.   If you develop chest pain, shortness of breath, dizziness/lightheadedness, please return to be seen or go to the ER.

## 2023-06-21 NOTE — ED Triage Notes (Signed)
Pt c/o needing refill on BP medication, pt states he missed his appointment with his primary care because he was out of town. Pt is aware this is the Final time we will refill his medication here at [facility] and has verbalized understanding.

## 2023-06-21 NOTE — ED Provider Notes (Signed)
[facility]    CSN: [id number] Arrival date & time: 06/21/23  1041      History   Chief Complaint No chief complaint on file.   HPI Shawn Campbell is a 62 y.o. male.   The history is provided by the patient.   The patient presents for medication refill request for lisinopril.  Patient takes fosinopril 40 mg daily.  Patient states that he has not "run out of the medication" which is why he is here.  He has received 2 refills of this medication within the past 90 days.  His last refill was in May.  Each time, patient verbalized that he was following up with the PCP.  Patient reports he missed his last appointment because he was out of town.  Patient denies chest pain, shortness of breath, difficulty breathing, lower extremity edema, headache, dizziness, or blurred vision. The patient reports that he is experiencing anxiety intermittently. He is taking ashwaganda for his anxiety. He also reports that he has been out of town and has been eating "junk". Patient thinks he may have gained "5 lbs" over the last several weeks.  Patient states his next appointment to establish care with a PCP has been rescheduled for 7/24.  Past Medical History:  Diagnosis Date   Asthma    as a child   Hypertension     There are no problems to display for this patient.   History reviewed. No pertinent surgical history.     Home Medications    Prior to Admission medications   Medication Sig Start Date End Date Taking? Authorizing Provider  fosinopril (MONOPRIL) 40 MG tablet Take 1 tablet (40 mg total) by mouth daily. 05/04/23 06/03/23  Valentino Nose, NP    Family History Family History  Problem Relation Age of Onset   Diabetes Father     Social History Social History   Tobacco Use   Smoking status: Never   Smokeless tobacco: Never  Substance Use Topics   Alcohol use: No   Drug use: No     Allergies   Patient has no known allergies.   Review of Systems Review of  Systems Per HPI  Physical Exam Triage Vital Signs ED Triage Vitals  Enc Vitals Group     BP 06/21/23 1051 (!) 164/93     Pulse Rate 06/21/23 1051 94     Resp 06/21/23 1051 15     Temp 06/21/23 1051 98.1 F (36.7 C)     Temp Source 06/21/23 1051 Oral     SpO2 06/21/23 1051 95 %     Weight --      Height --      Head Circumference --      Peak Flow --      Pain Score 06/21/23 1054 0     Pain Loc --      Pain Edu? --      Excl. in GC? --    No data found.  Updated Vital Signs BP (!) 164/93 (BP Location: Right Arm)   Pulse 94   Temp 98.1 F (36.7 C) (Oral)   Resp 15   SpO2 95%   Visual Acuity Right Eye Distance:   Left Eye Distance:   Bilateral Distance:    Right Eye Near:   Left Eye Near:    Bilateral Near:     Physical Exam Vitals and nursing note reviewed.  Constitutional:      General: He is not in acute distress.  Appearance: Normal appearance.  HENT:     Head: Normocephalic.  Eyes:     Extraocular Movements: Extraocular movements intact.     Conjunctiva/sclera: Conjunctivae normal.     Pupils: Pupils are equal, round, and reactive to light.  Cardiovascular:     Rate and Rhythm: Normal rate and regular rhythm.     Pulses: Normal pulses.     Heart sounds: Normal heart sounds.  Pulmonary:     Effort: Pulmonary effort is normal. No respiratory distress.     Breath sounds: Normal breath sounds. No stridor. No wheezing, rhonchi or rales.  Abdominal:     General: Bowel sounds are normal.     Palpations: Abdomen is soft.     Tenderness: There is no abdominal tenderness.  Musculoskeletal:     Cervical back: Normal range of motion.  Lymphadenopathy:     Cervical: No cervical adenopathy.  Skin:    General: Skin is warm and dry.  Neurological:     General: No focal deficit present.     Mental Status: He is alert and oriented to person, place, and time.  Psychiatric:        Mood and Affect: Mood normal.        Behavior: Behavior normal.      UC  Treatments / Results  Labs (all labs ordered are listed, but only abnormal results are displayed) Labs Reviewed - No data to display  EKG   Radiology No results found.  Procedures Procedures (including critical care time)  Medications Ordered in UC Medications - No data to display  Initial Impression / Assessment and Plan / UC Course  I have reviewed the triage vital signs and the nursing notes.  Pertinent labs & imaging results that were available during my care of the patient were reviewed by me and considered in my medical decision making (see chart for details).  The patient is well-appearing, he is no acute distress. He is hypertensive.   BMP pending. Advised patient that lab work should be monitored and that he does not have recent labs work. Patient is scheduled to see his PCP on 7/24, will provide refill for 15 days to cover until he can see PCP. Stressed the importance of keeping his appointment as UC is not appropriate for treatment of hypertension. Supportive care recommendations were discussed with the patient to include increasing fluids, dietary modifications with lower sodium intake and considering regular exercise. Patient advised to go to the ER if he develops SOB, difficulty breathing, chest pain, blurred vision, HA, or LE edema.  Patient advised he will be contacted if the lab results are abnormal. Stressed the importance of keep appointment with PCP. Patient verbalizes understanding and is in agreement with this plan of care. All questions were answered, patient is stable for discharge.  Final Clinical Impressions(s) / UC Diagnoses   Final diagnoses:  None   Discharge Instructions   None    ED Prescriptions   None    PDMP not reviewed this encounter.   Abran Cantor, NP 06/21/23 1127

## 2023-06-22 LAB — BASIC METABOLIC PANEL
BUN/Creatinine Ratio: 14 (ref 10–24)
BUN: 14 mg/dL (ref 8–27)
CO2: 21 mmol/L (ref 20–29)
Calcium: 10.3 mg/dL — ABNORMAL HIGH (ref 8.6–10.2)
Chloride: 99 mmol/L (ref 96–106)
Creatinine, Ser: 0.98 mg/dL (ref 0.76–1.27)
Glucose: 313 mg/dL — ABNORMAL HIGH (ref 70–99)
Potassium: 4.9 mmol/L (ref 3.5–5.2)
Sodium: 137 mmol/L (ref 134–144)
eGFR: 87 mL/min/{1.73_m2} (ref >59)

## 2023-07-05 DIAGNOSIS — E1165 Type 2 diabetes mellitus with hyperglycemia: Secondary | ICD-10-CM | POA: Diagnosis not present

## 2023-07-05 DIAGNOSIS — Z13220 Encounter for screening for lipoid disorders: Secondary | ICD-10-CM | POA: Diagnosis not present

## 2023-07-05 DIAGNOSIS — I10 Essential (primary) hypertension: Secondary | ICD-10-CM | POA: Insufficient documentation

## 2023-07-14 ENCOUNTER — Ambulatory Visit: Payer: 59 | Admitting: Family Medicine

## 2023-07-14 DIAGNOSIS — Z7689 Persons encountering health services in other specified circumstances: Secondary | ICD-10-CM

## 2023-12-19 ENCOUNTER — Encounter (HOSPITAL_COMMUNITY): Payer: Self-pay

## 2023-12-19 ENCOUNTER — Emergency Department (HOSPITAL_COMMUNITY)
Admission: EM | Admit: 2023-12-19 | Discharge: 2023-12-19 | Disposition: A | Discharge status: Home / Self Care | Payer: No Typology Code available for payment source | Attending: Emergency Medicine | Admitting: Emergency Medicine

## 2023-12-19 DIAGNOSIS — R739 Hyperglycemia, unspecified: Secondary | ICD-10-CM

## 2023-12-19 DIAGNOSIS — Z7984 Long term (current) use of oral hypoglycemic drugs: Secondary | ICD-10-CM | POA: Diagnosis not present

## 2023-12-19 DIAGNOSIS — Z79899 Other long term (current) drug therapy: Secondary | ICD-10-CM | POA: Diagnosis not present

## 2023-12-19 DIAGNOSIS — F419 Anxiety disorder, unspecified: Secondary | ICD-10-CM | POA: Diagnosis not present

## 2023-12-19 DIAGNOSIS — E1165 Type 2 diabetes mellitus with hyperglycemia: Secondary | ICD-10-CM | POA: Diagnosis not present

## 2023-12-19 DIAGNOSIS — I10 Essential (primary) hypertension: Secondary | ICD-10-CM | POA: Insufficient documentation

## 2023-12-19 LAB — COMPREHENSIVE METABOLIC PANEL
ALT: 35 U/L (ref 0–44)
AST: 23 U/L (ref 15–41)
Albumin: 4.1 g/dL (ref 3.5–5.0)
Alkaline Phosphatase: 67 U/L (ref 38–126)
Anion gap: 9 (ref 5–15)
BUN: 17 mg/dL (ref 8–23)
CO2: 25 mmol/L (ref 22–32)
Calcium: 9.7 mg/dL (ref 8.9–10.3)
Chloride: 99 mmol/L (ref 98–111)
Creatinine, Ser: 0.95 mg/dL (ref 0.61–1.24)
GFR, Estimated: >60 mL/min (ref >60)
Glucose, Bld: 217 mg/dL — ABNORMAL HIGH (ref 70–99)
Potassium: 4 mmol/L (ref 3.5–5.1)
Sodium: 133 mmol/L — ABNORMAL LOW (ref 135–145)
Total Bilirubin: 0.8 mg/dL (ref 0.0–1.2)
Total Protein: 7.2 g/dL (ref 6.5–8.1)

## 2023-12-19 LAB — CBC WITH DIFFERENTIAL/PLATELET
Abs Immature Granulocytes: 0.04 10*3/uL (ref 0.00–0.07)
Basophils Absolute: 0.1 10*3/uL (ref 0.0–0.1)
Basophils Relative: 1 %
Eosinophils Absolute: 0.1 10*3/uL (ref 0.0–0.5)
Eosinophils Relative: 1 %
HCT: 54.4 % — ABNORMAL HIGH (ref 39.0–52.0)
Hemoglobin: 18.4 g/dL — ABNORMAL HIGH (ref 13.0–17.0)
Immature Granulocytes: 1 %
Lymphocytes Relative: 21 %
Lymphs Abs: 1.4 10*3/uL (ref 0.7–4.0)
MCH: 30 pg (ref 26.0–34.0)
MCHC: 33.8 g/dL (ref 30.0–36.0)
MCV: 88.6 fL (ref 80.0–100.0)
Monocytes Absolute: 0.4 10*3/uL (ref 0.1–1.0)
Monocytes Relative: 6 %
Neutro Abs: 4.7 10*3/uL (ref 1.7–7.7)
Neutrophils Relative %: 70 %
Platelets: 235 10*3/uL (ref 150–400)
RBC: 6.14 MIL/uL — ABNORMAL HIGH (ref 4.22–5.81)
RDW: 12.8 % (ref 11.5–15.5)
WBC: 6.6 10*3/uL (ref 4.0–10.5)
nRBC: 0 % (ref 0.0–0.2)

## 2023-12-19 LAB — LACTIC ACID, PLASMA: Lactic Acid, Venous: 1.8 mmol/L (ref 0.5–1.9)

## 2023-12-19 NOTE — ED Triage Notes (Addendum)
 Pt reports intermittent shaking and elevated BP since starting metformin x1 week ago.  Pt reports systolic of 175 this morning.  Pt reports he has recently had a significant amount of stress in his life.  Sts he has been eating and drinking well.  Denies diarrhea.   Pt sts walking improves symptoms and symptoms seem to improve throughout the day.  Sts symptoms are the worst immediately after waking up.     Pt reports he has been prescribed Zoloft by PCP, but he has not started taking the prescription.

## 2023-12-19 NOTE — Discharge Instructions (Addendum)
 your testing was reassuring, you do not need to change any of your medications, please take the metformin consistently, if your symptoms are getting worse she should stop this medication and talk to your family doctor.  I would recommend that they see you within the next 1 to 2 weeks for a recheck, ER for worsening symptoms.  Please make sure you are limiting how much caffeine you take, avoid over-the-counter cough or cold medications or other supplements that may be stimulants.  I would also recommend starting the sertraline as guided by your doctor.

## 2023-12-19 NOTE — ED Provider Notes (Signed)
 [place] [facility] AT [facility] Provider Note   CSN: [id number] Arrival date & time: 12/19/23  9089     History  Chief Complaint  Patient presents with   Possible Medication Reaction   Anxiety    Shawn Campbell is a 63 y.o. male.   Anxiety   This patient is a 63 year old male, he has a history of hypertension and diabetes, he reports that the diabetes was diagnosed in July however he has not been on any prescription medications because he was taking an over-the-counter supplement that he had read might help.  His hemoglobin A1c continued to be elevated as high as 10 and because of this he decided to start taking the metformin about a week ago.  Over the last few days the patient is felt particularly nervous in the morning like he is jittery and easy to spooked, this gets better throughout the day and when he goes to bed at night he feels back to normal.  He denies any other symptoms, he has not had urinary frequency, rectal bleeding, chest pain shortness of breath or any other complaints.    Home Medications Prior to Admission medications   Medication Sig Start Date End Date Taking? Authorizing Provider  fosinopril  (MONOPRIL ) 40 MG tablet Take 1 tablet (40 mg total) by mouth daily. 06/21/23 12/19/23 Yes Leath-Warren, Etta PARAS, NP  metFORMIN (GLUCOPHAGE-XR) 500 MG 24 hr tablet Take 1 tablet by mouth daily with breakfast. 07/05/23  Yes [provider]  sertraline (ZOLOFT) 25 MG tablet Take 25 mg by mouth daily. 12/07/23 01/06/24 Yes [provider]      Allergies    Patient has no known allergies.    Review of Systems   Review of Systems  All other systems reviewed and are negative.   Physical Exam Updated Vital Signs BP (!) 165/101   Pulse 91   Temp 99.1 F (37.3 C) (Oral)   Resp 18   Ht 1.676 m (5' 6)   SpO2 96%   BMI 29.05 kg/m  Physical Exam Vitals and nursing note reviewed.  Constitutional:      General: He is not in  acute distress.    Appearance: He is well-developed.  HENT:     Head: Normocephalic and atraumatic.     Mouth/Throat:     Pharynx: No oropharyngeal exudate.  Eyes:     General: No scleral icterus.       Right eye: No discharge.        Left eye: No discharge.     Conjunctiva/sclera: Conjunctivae normal.     Pupils: Pupils are equal, round, and reactive to light.  Neck:     Thyroid: No thyromegaly.     Vascular: No JVD.  Cardiovascular:     Rate and Rhythm: Normal rate and regular rhythm.     Heart sounds: Normal heart sounds. No murmur heard.    No friction rub. No gallop.  Pulmonary:     Effort: Pulmonary effort is normal. No respiratory distress.     Breath sounds: Normal breath sounds. No wheezing or rales.  Abdominal:     General: Bowel sounds are normal. There is no distension.     Palpations: Abdomen is soft. There is no mass.     Tenderness: There is no abdominal tenderness.  Musculoskeletal:        General: No tenderness. Normal range of motion.     Cervical back: Normal range of motion and neck supple.  Right lower leg: No edema.     Left lower leg: No edema.  Lymphadenopathy:     Cervical: No cervical adenopathy.  Skin:    General: Skin is warm and dry.     Findings: No erythema or rash.  Neurological:     Mental Status: He is alert.     Coordination: Coordination normal.     Comments: The patient is able to answer my questions, he is not responding to any internal stimuli, he does appear slightly anxious  Psychiatric:        Behavior: Behavior normal.     ED Results / Procedures / Treatments   Labs (all labs ordered are listed, but only abnormal results are displayed) Labs Reviewed  CBC WITH DIFFERENTIAL/PLATELET - Abnormal; Notable for the following components:      Result Value   RBC 6.14 (*)    Hemoglobin 18.4 (*)    HCT 54.4 (*)    All other components within normal limits  COMPREHENSIVE METABOLIC PANEL - Abnormal; Notable for the following  components:   Sodium 133 (*)    Glucose, Bld 217 (*)    All other components within normal limits  LACTIC ACID, PLASMA  CBG MONITORING, ED    EKG None  Radiology No results found.  Procedures Procedures    Medications Ordered in ED Medications - No data to display  ED Course/ Medical Decision Making/ A&P                                 Medical Decision Making Amount and/or Complexity of Data Reviewed Labs: ordered. ECG/medicine tests: ordered.    This patient presents to the ED for concern of anxiety and tremulousness, this involves an extensive number of treatment options, and is a complaint that carries with it a high risk of complications and morbidity.  The differential diagnosis includes anxiety reaction, could be a medication reaction to the metformin, could be a lactic acidosis of some kind or electrolyte disturbance   Co morbidities that complicate the patient evaluation  Diabetes and hypertension   Additional history obtained:  Additional history obtained from medical record External records from outside source obtained and reviewed including hemoglobin A1c was 10.212 days ago   Lab Tests:  I Ordered, and personally interpreted labs.  The pertinent results include: CBC and metabolic panel, glucose of 217 with no anion gap or any signs of acidosis.  No leukocytosis, normal lactic acid unremarkable other than some hyperglycemia which was expected    Cardiac Monitoring: / EKG:  The patient was maintained on a cardiac monitor.  I personally viewed and interpreted the cardiac monitored which showed an underlying rhythm of: Normal sinus rhythm rate in the 90s     Problem List / ED Course / Critical interventions / Medication management  Patient well-appearing, no signs of tremor or focal neurologic deficit, seems to be more consistent with anxiety, labs unremarkable, patient given reassurance, can follow-up outpatient I have reviewed the patients home  medicines and have made adjustments as needed   Social Determinants of Health:  None   Test / Admission - Considered:  Stable for discharge         Final Clinical Impression(s) / ED Diagnoses Final diagnoses:  Hypertension, unspecified type  Hyperglycemia  Anxiety    Rx / DC Orders ED Discharge Orders     None         Cleotilde,  Redell, MD 12/19/23 1049

## 2024-01-22 ENCOUNTER — Emergency Department (HOSPITAL_COMMUNITY): Payer: No Typology Code available for payment source

## 2024-01-22 ENCOUNTER — Other Ambulatory Visit: Payer: Self-pay

## 2024-01-22 ENCOUNTER — Encounter (HOSPITAL_COMMUNITY): Payer: Self-pay

## 2024-01-22 ENCOUNTER — Emergency Department (HOSPITAL_COMMUNITY)
Admission: EM | Admit: 2024-01-22 | Discharge: 2024-01-22 | Disposition: A | Discharge status: Home / Self Care | Payer: No Typology Code available for payment source | Attending: Emergency Medicine | Admitting: Emergency Medicine

## 2024-01-22 DIAGNOSIS — I10 Essential (primary) hypertension: Secondary | ICD-10-CM | POA: Diagnosis present

## 2024-01-22 DIAGNOSIS — Z7984 Long term (current) use of oral hypoglycemic drugs: Secondary | ICD-10-CM | POA: Diagnosis not present

## 2024-01-22 DIAGNOSIS — Z79899 Other long term (current) drug therapy: Secondary | ICD-10-CM | POA: Insufficient documentation

## 2024-01-22 DIAGNOSIS — E119 Type 2 diabetes mellitus without complications: Secondary | ICD-10-CM | POA: Insufficient documentation

## 2024-01-22 DIAGNOSIS — F410 Panic disorder [episodic paroxysmal anxiety] without agoraphobia: Secondary | ICD-10-CM | POA: Insufficient documentation

## 2024-01-22 LAB — GLUCOSE, CAPILLARY: Glucose-Capillary: 204 mg/dL — ABNORMAL HIGH (ref 70–99)

## 2024-01-22 MED ORDER — ALPRAZOLAM 0.25 MG PO TABS: 0.2500 mg | ORAL_TABLET | Freq: Every evening | ORAL | 0 refills | Status: AC | PRN

## 2024-01-22 MED ORDER — ALPRAZOLAM 0.5 MG PO TABS
0.2500 mg | ORAL_TABLET | Freq: Once | ORAL | Status: AC
  Administered 2024-01-22: 0.25 mg via ORAL
  Filled 2024-01-22: qty 1

## 2024-01-22 NOTE — ED Triage Notes (Signed)
 Pt arrived via POV c/o hypertension and tachycardia that began last night. Pt reports taking his medications as prescribed. Pt does endorse mild anxiety.

## 2024-01-22 NOTE — ED Provider Notes (Signed)
[place] EM[facility]PARTMENT AT [facility] Provider Note   CSN: [id number] Arrival date & time: 01/22/24  3664     History Chief Complaint  Patient presents with   Hypertension    Roshad Hack is a 63 y.o. male.  Patient with past history significant for hypertension, type 2 diabetes, anxiety presents the emergency department with concerns of hypertension.  He reports he began to experience hypotension yesterday evening during the [place].  States that he has had some worsening in symptoms off and on for several weeks and is attributing this to his metformin that he started in January for type 2 diabetes. He states that he is also taking Zoloft but is only at 12.5mg  at this time without significant improvement. Not taking any benzodiazepines. States that this last year has been harder on him due to the lose of two close friends. Denies SI, HI, or thoughts of self-harm.   Hypertension       Home Medications Prior to Admission medications   Medication Sig Start Date End Date Taking? Authorizing Provider  ALPRAZolam (XANAX) 0.25 MG tablet Take 1 tablet (0.25 mg total) by mouth at bedtime as needed for anxiety. 01/22/24  Yes Maryanna Shape A, PA-C  amLODipine (NORVASC) 5 MG tablet Take 1 tablet by mouth daily. 12/27/23  Yes [provider]  fosinopril (MONOPRIL) 40 MG tablet Take 1 tablet by mouth daily. 01/22/24  Yes [provider]  sertraline (ZOLOFT) 25 MG tablet Take 25 mg by mouth daily. 01/08/24  Yes [provider]  fosinopril (MONOPRIL) 40 MG tablet Take 1 tablet (40 mg total) by mouth daily. 06/21/23 12/19/23  Leath-Warren, Sadie Haber, NP  metFORMIN (GLUCOPHAGE-XR) 500 MG 24 hr tablet Take 1 tablet by mouth daily with breakfast. 07/05/23   [provider]  sertraline (ZOLOFT) 25 MG tablet Take 25 mg by mouth daily. 12/07/23 01/06/24  [provider]      Allergies    Patient has no known allergies.    Review of Systems    Review of Systems  Psychiatric/Behavioral:         Anxiety  All other systems reviewed and are negative.   Physical Exam Updated Vital Signs BP (!) 173/98 (BP Location: Right Arm)   Pulse 94   Temp 98.4 F (36.9 C) (Oral)   Resp 18   Ht 5\' 6"  (1.676 m)   Wt 82 kg   SpO2 99%   BMI 29.18 kg/m  Physical Exam Vitals and nursing note reviewed.  Constitutional:      General: He is not in acute distress.    Appearance: He is well-developed.  HENT:     Head: Normocephalic and atraumatic.  Eyes:     Conjunctiva/sclera: Conjunctivae normal.  Cardiovascular:     Rate and Rhythm: Normal rate and regular rhythm.     Heart sounds: No murmur heard. Pulmonary:     Effort: Pulmonary effort is normal. No respiratory distress.     Breath sounds: Normal breath sounds.  Abdominal:     Palpations: Abdomen is soft.     Tenderness: There is no abdominal tenderness.  Musculoskeletal:        General: No swelling.     Cervical back: Neck supple.  Skin:    General: Skin is warm and dry.     Capillary Refill: Capillary refill takes less than 2 seconds.  Neurological:     Mental Status: He is alert.  Psychiatric:  Mood and Affect: Mood normal.     Comments: Anxious. Pressured speech but no tangential thinking.     ED Results / Procedures / Treatments   Labs (all labs ordered are listed, but only abnormal results are displayed) Labs Reviewed  GLUCOSE, CAPILLARY - Abnormal; Notable for the following components:      Result Value   Glucose-Capillary 204 (*)    All other components within normal limits  CBG MONITORING, ED    EKG EKG Interpretation Date/Time:  Monday January 22 2024 09:55:16 EST Ventricular Rate:  84 PR Interval:  178 QRS Duration:  88 QT Interval:  344 QTC Calculation: 406 R Axis:   -10  Text Interpretation: Normal sinus rhythm Normal ECG When compared with ECG of 18-May-2021 21:13, PREVIOUS ECG IS PRESENT No significant change since last tracing  Confirmed by Derwood Kaplan ([id number]) on 01/22/2024 11:57:33 AM  Radiology DG Chest 2 View Result Date: 01/22/2024 CLINICAL DATA:  Shortness of breath. EXAM: CHEST - 2 VIEW COMPARISON:  08/28/2017. FINDINGS: Low lung volume. Bilateral lung fields are clear. Bilateral costophrenic angles are clear. Normal cardio-mediastinal silhouette. No acute osseous abnormalities. The soft tissues are within normal limits. IMPRESSION: No active cardiopulmonary disease. Electronically Signed   By: Jules Schick M.D.   On: 01/22/2024 11:28    Procedures Procedures    Medications Ordered in ED Medications  ALPRAZolam Prudy Feeler) tablet 0.25 mg (0.25 mg Oral Given 01/22/24 1124)    ED Course/ Medical Decision Making/ A&P                                 Medical Decision Making Amount and/or Complexity of Data Reviewed Radiology: ordered.  Risk Prescription drug management.    This patient presents to the ED for concern of hypertension. Differential diagnosis includes panic attack, asymptomatic hypertension, hypertensive urgency   Lab Tests:  I Ordered, and personally interpreted labs.  The pertinent results include:  CBG elevated at 204   Imaging Studies ordered:  I ordered imaging studies including chest xray  I independently visualized and interpreted imaging which showed no acute cardiopulmonary process I agree with the radiologist interpretation   Medicines ordered and prescription drug management:  I ordered medication including Xanax for panic attack Reevaluation of the patient after these medicines showed that the patient improved I have reviewed the patients home medicines and have made adjustments as needed   Problem List / ED Course:  Patient presents to the emergency department concerns of anxiety and hypertension.  He states that he began to feel some of the tachycardia and hypertension last night while watching the football game.  He states that he woke up this morning with  symptoms still present.  Denies any feelings of heart palpitations.  States that he takes Zoloft for his anxiety started about 1 month ago.  States that his PCP is still titrating up this medication.  Also endorses starting metformin at 1 month ago and feels that he has worsening anxiety when he takes the metformin.  I spoke with his PCP about this and has been advised that there is no clear interaction or side effect of anxiety from metformin. On exam, patient does appear to be anxious and has somewhat pressured speech but is appropriate to thought content.  No SI or HI.  Patient does not have any appreciable tachycardia on blood pressure is noted to be elevated.  No chest pain, shortness of breath, leg  swelling, or vision changes.  Doubt hypertensive emergency.  Will minister 1 dose of a 0.25 mg alprazolam.  Will reassess shortly. Patient with improvement in symptoms after low-dose Xanax administered.  I suspect patient and her secondary to poorly controlled anxiety/panic attacks.  Patient's blood pressure is also improved with pressure in the room when I was at bedside at 117/74. Advised patient to discuss management of anxiety with PCP. A very short supply of Xanax sent to pharmacy with instructions to discuss refilling with PCP if needed. There is likely other components behind his anxiety as he remarks that he has struggled more in the last year after losing two very close friends. With BP improvement, I do not feel there is an indication for emergent BP management in the ED. Again, I suspect his HTN was reactive to a panic attack. Patient currently otherwise stable and will follow up with PCP. No other acute or focal concerns at this time.  Final Clinical Impression(s) / ED Diagnoses Final diagnoses:  Panic attack  Hypertension, unspecified type    Rx / DC Orders ED Discharge Orders          Ordered    ALPRAZolam (XANAX) 0.25 MG tablet  At bedtime PRN        01/22/24 1239               Smitty Knudsen, PA-C 01/22/24 1251    Derwood Kaplan, MD 01/23/24 [phone]

## 2024-01-22 NOTE — Discharge Instructions (Signed)
 You were seen in the ER today for concerns of hypertension. Your blood pressure was elevated, but this appears to have been due to anxiety. You were given a dose of Xanax to help which did improve your overall symptoms and your blood pressure became normal. I would discuss further management of your anxiety with primary care provider. I have sent a very low dose of Xanax to your pharmacy to help with panic attacks but this should be refilled by your primary care provider going forward if they feel it is appropriate for you. Return to the ER for any new or worsening symptoms.
